# Patient Record
Sex: Female | Born: 1995 | Race: Black or African American | Hispanic: No | Marital: Single | Smoking: Never smoker
Health system: Southern US, Community
[De-identification: ages and names within clinical notes are randomized; demographics above are authoritative.]

---
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
## 2017-10-28 IMAGING — CR DG ELBOW COMPLETE 3+V*L*
4 series · 4 of 4 positions shown · non-contrast
Comparison: None.

CLINICAL DATA: Patient slipped injuring left elbow 6 days prior
disc level. Joint and proximal forearm pain.

EXAM:
LEFT ELBOW - COMPLETE 3+ VIEW

[x elbow ap left]
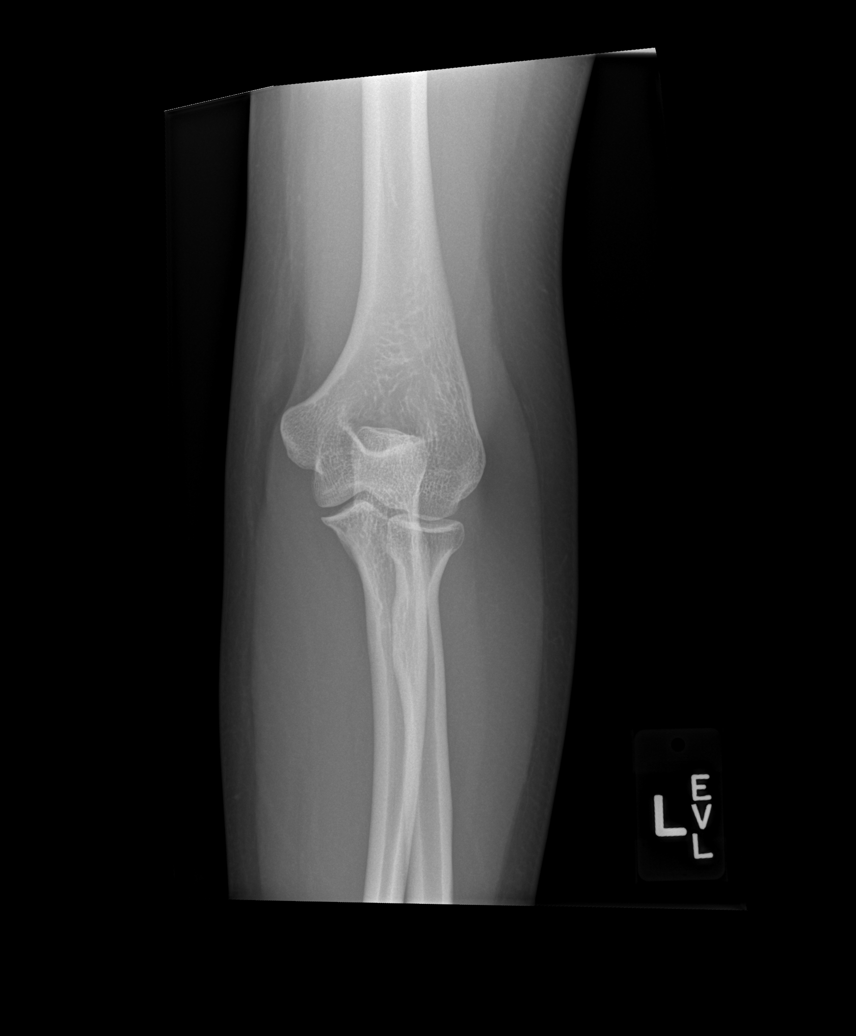

[x elbow obl left (1 of 2)]
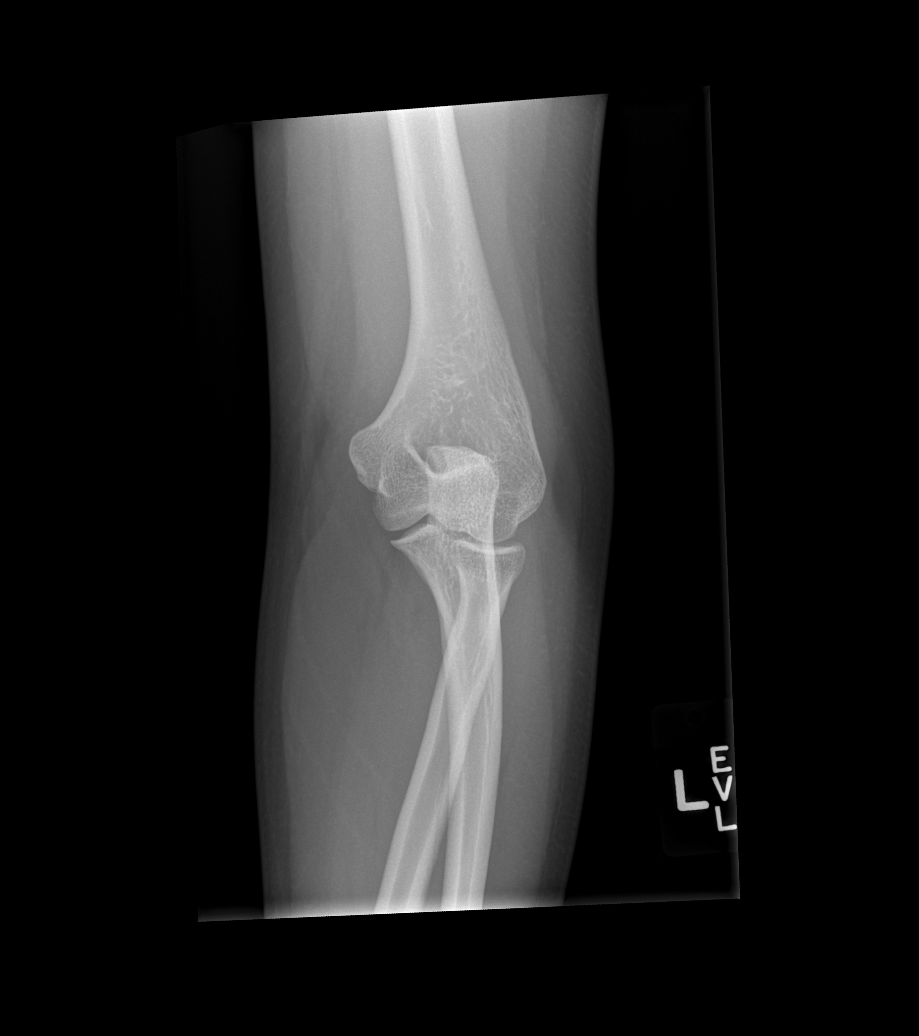

[x elbow obl left (2 of 2)]
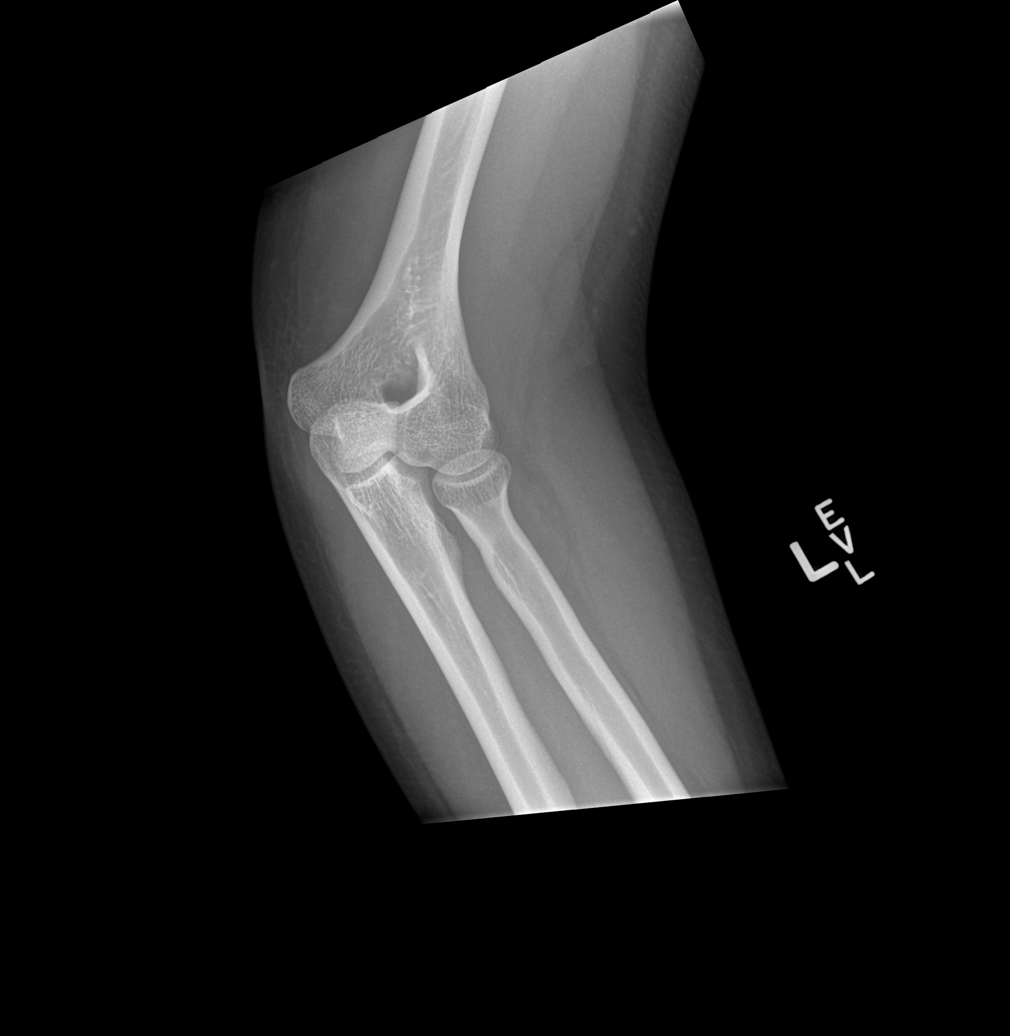

[x elbow lat left]
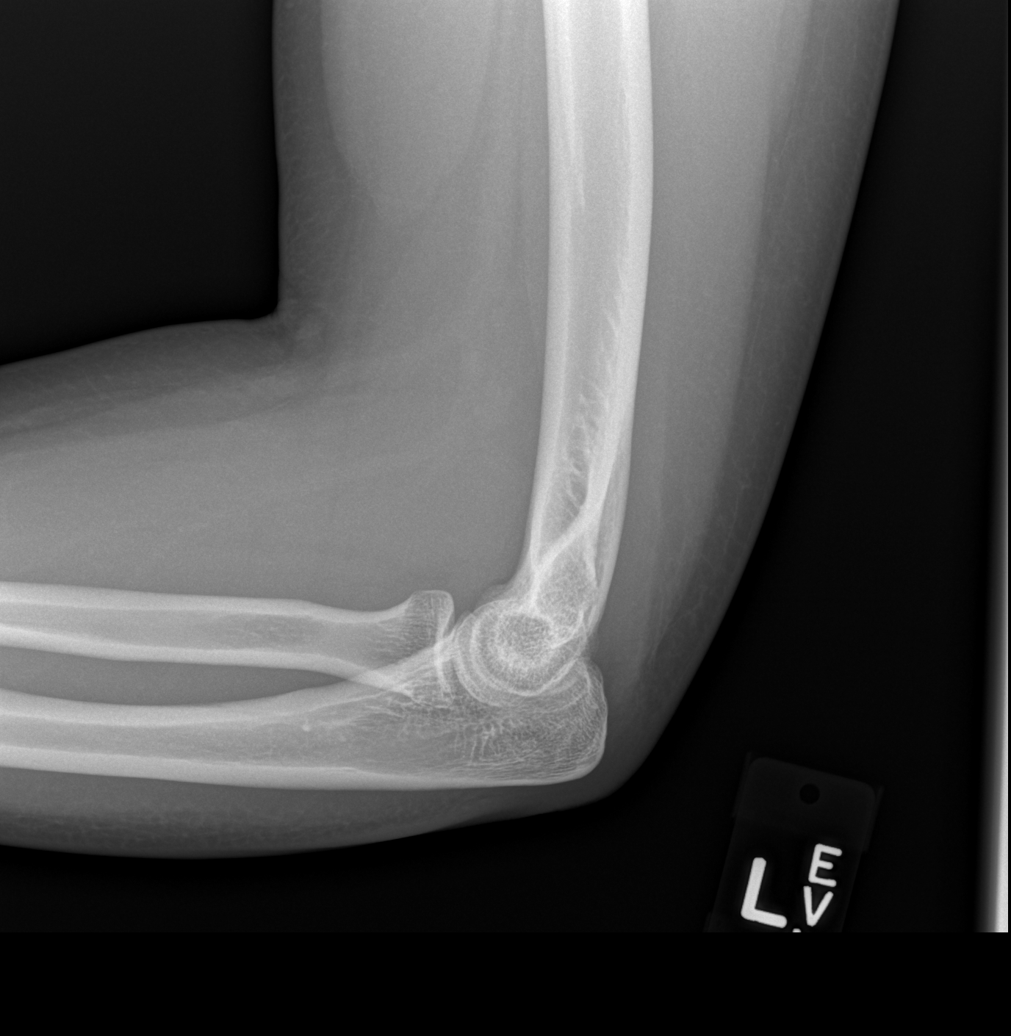

[4 of 4 positions shown; findings below may reference images not displayed]

FINDINGS: There is no evidence of fracture, dislocation, or joint effusion.
There is no evidence of arthropathy or other focal bone abnormality.
Soft tissues are unremarkable.
IMPRESSION: No acute osseous abnormality. No joint effusion or malalignment of
the left elbow.
